# Patient Record
Sex: Female | Born: 1985 | Hispanic: No | Marital: Single | Smoking: Never smoker
Health system: Southern US, Community
[De-identification: ages and names within clinical notes are randomized; demographics above are authoritative.]

---

## 2016-05-30 ENCOUNTER — Encounter (HOSPITAL_COMMUNITY): Payer: Self-pay | Admitting: Emergency Medicine

## 2016-05-30 ENCOUNTER — Emergency Department (HOSPITAL_COMMUNITY): Payer: No Typology Code available for payment source

## 2016-05-30 ENCOUNTER — Emergency Department (HOSPITAL_COMMUNITY)
Admission: EM | Admit: 2016-05-30 | Discharge: 2016-05-30 | Disposition: A | Discharge status: Home / Self Care | Payer: No Typology Code available for payment source | Attending: Emergency Medicine | Admitting: Emergency Medicine

## 2016-05-30 DIAGNOSIS — R930 Abnormal findings on diagnostic imaging of skull and head, not elsewhere classified: Secondary | ICD-10-CM | POA: Insufficient documentation

## 2016-05-30 DIAGNOSIS — Y92410 Unspecified street and highway as the place of occurrence of the external cause: Secondary | ICD-10-CM | POA: Diagnosis not present

## 2016-05-30 DIAGNOSIS — S3992XA Unspecified injury of lower back, initial encounter: Secondary | ICD-10-CM | POA: Diagnosis present

## 2016-05-30 DIAGNOSIS — S161XXA Strain of muscle, fascia and tendon at neck level, initial encounter: Secondary | ICD-10-CM

## 2016-05-30 DIAGNOSIS — Y9389 Activity, other specified: Secondary | ICD-10-CM | POA: Diagnosis not present

## 2016-05-30 DIAGNOSIS — Y999 Unspecified external cause status: Secondary | ICD-10-CM | POA: Insufficient documentation

## 2016-05-30 DIAGNOSIS — S39012A Strain of muscle, fascia and tendon of lower back, initial encounter: Secondary | ICD-10-CM | POA: Diagnosis not present

## 2016-05-30 LAB — POC URINE PREG, ED: Preg Test, Ur: NEGATIVE

## 2016-05-30 MED ORDER — IBUPROFEN 400 MG PO TABS: 400.0000 mg | ORAL_TABLET | Freq: Three times a day (TID) | ORAL | 0 refills | Status: AC | PRN

## 2016-05-30 MED ORDER — METHOCARBAMOL 500 MG PO TABS
500.0000 mg | ORAL_TABLET | Freq: Three times a day (TID) | ORAL | 0 refills | Status: AC | PRN
Start: 2016-05-30 — End: ?
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
## 2016-05-30 NOTE — ED Notes (Signed)
Given  ginger ale  to  drink

## 2016-07-12 ENCOUNTER — Ambulatory Visit (INDEPENDENT_AMBULATORY_CARE_PROVIDER_SITE_OTHER): Payer: Self-pay | Admitting: Physician Assistant
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
